# Patient Record
Sex: Male | Born: 1970 | Race: White | Hispanic: No | Marital: Married | Smoking: Former smoker
Health system: Southern US, Community
[De-identification: ages and names within clinical notes are randomized; demographics above are authoritative.]

## PROBLEM LIST (undated history)

## (undated) DIAGNOSIS — H3530 Unspecified macular degeneration: Secondary | ICD-10-CM

## (undated) HISTORY — PX: APPENDECTOMY: SHX54

---

## 2011-05-02 ENCOUNTER — Emergency Department: Payer: Self-pay

## 2012-08-26 IMAGING — CT CT ABD-PELV W/O CM
1 of 2 series · 15 of 32 positions shown, 19 images · non-contrast
Comparison: none

REASON FOR EXAM: (1) Left flank pain, abdominal pain; (2) Left flank
pain, abdominal pain
COMMENTS:   May transport without cardiac monitor

PROCEDURE:     CT  - CT ABDOMEN AND PELVIS W[DATE]  [DATE]
RESULT:     CT abdomen pelvis dated 05/02/2011.
TECHNIQUE: Helical noncontrasted 3 mm sections were obtained from the lung
bases through the pubic symphysis.

[Series 2: stone · axial · 0.75mm/px · z∈[-514,-22]mm · 15 of 178 slices shown, 19 images]
[im 7/178  soft-tissue]
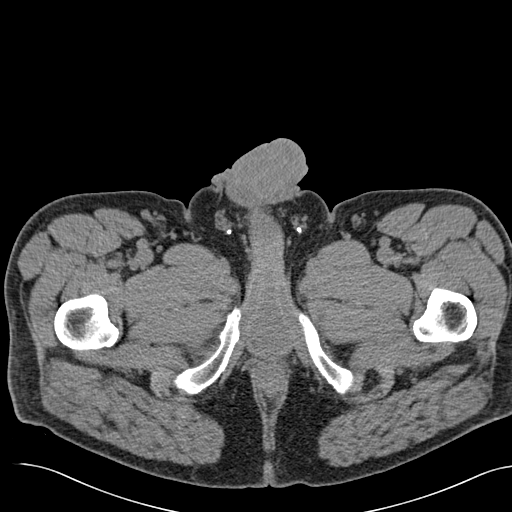
[im 7/178  bone]
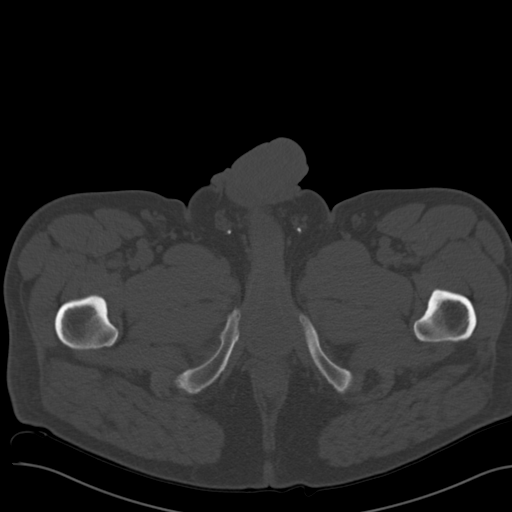
[im 21/178  soft-tissue]
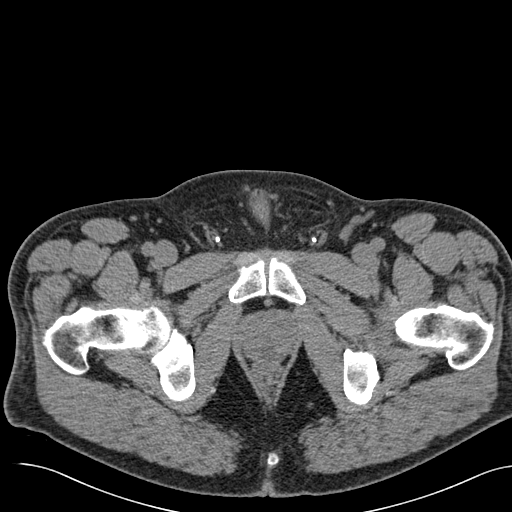
[im 35/178  soft-tissue]
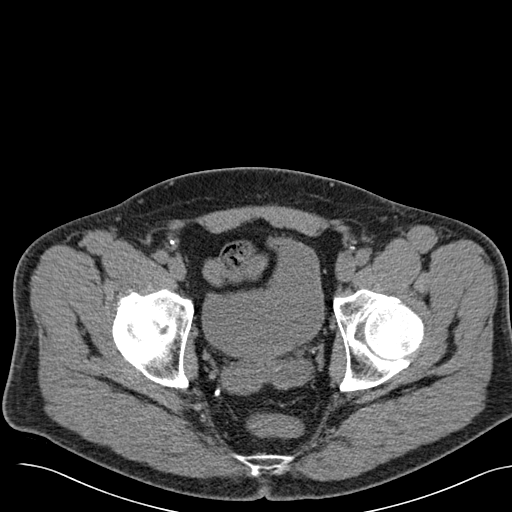
[im 48/178  soft-tissue]
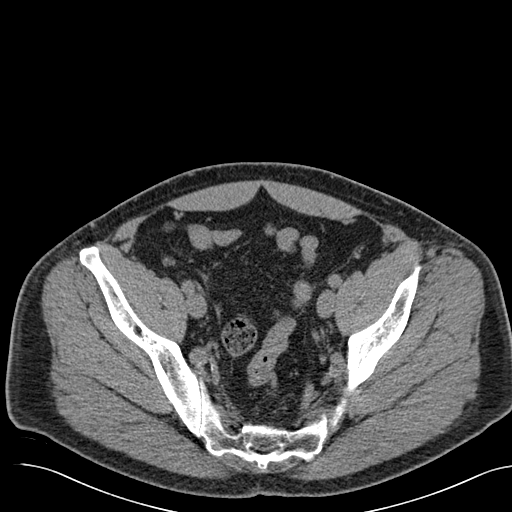
[im 62/178  soft-tissue]
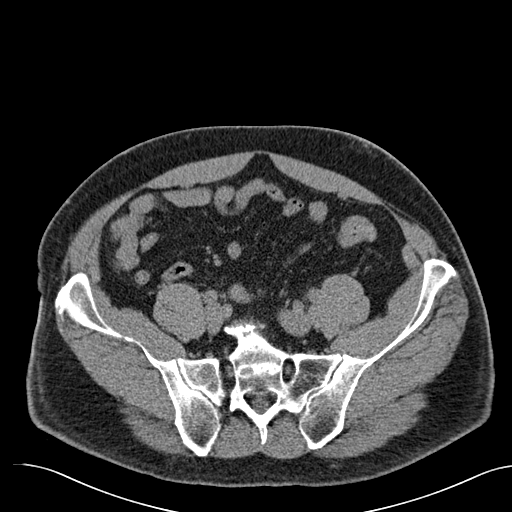
[im 75/178  soft-tissue]
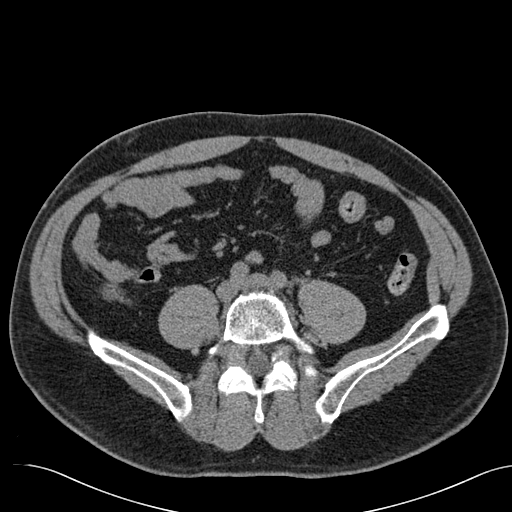
[im 89/178  soft-tissue]
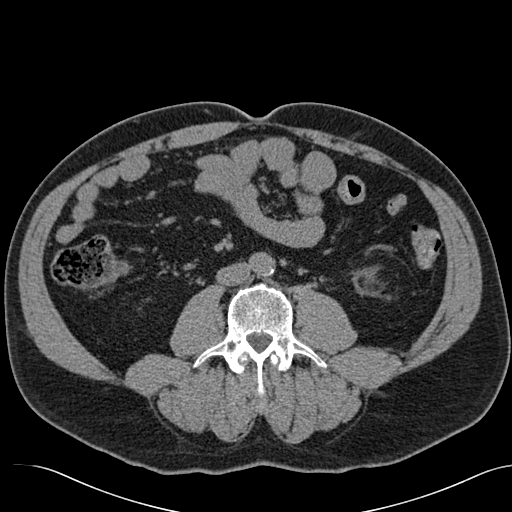
[im 103/178  soft-tissue]
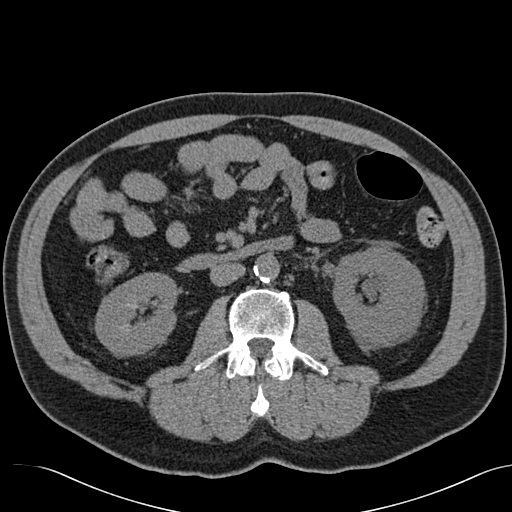
[im 116/178  soft-tissue]
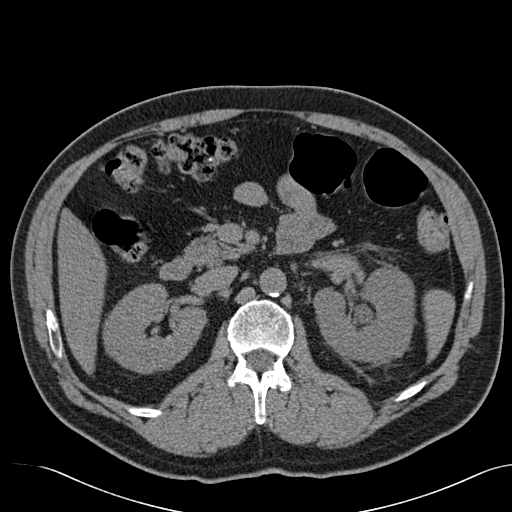
[im 116/178  bone]
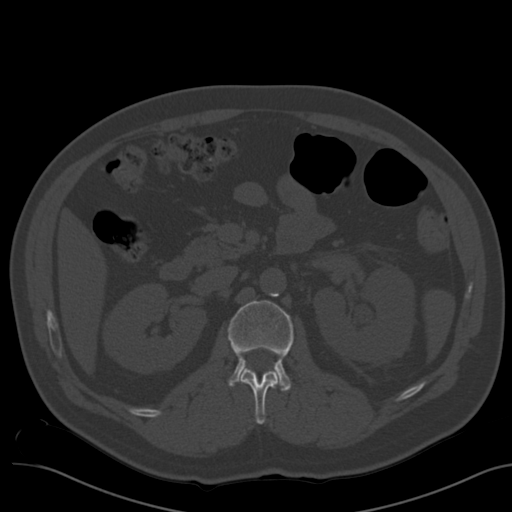
[im 130/178  soft-tissue]
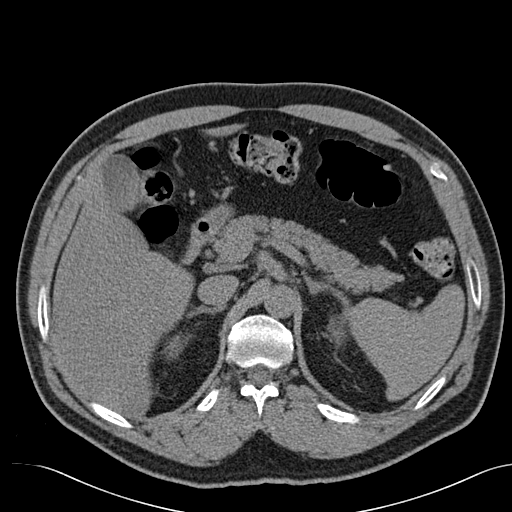
[im 143/178  soft-tissue]
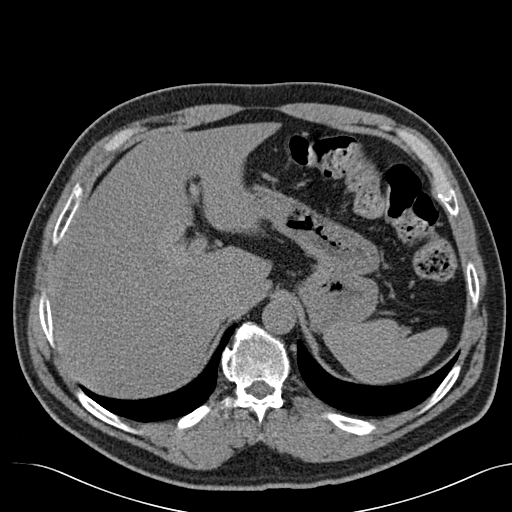
[im 150/178  lung]
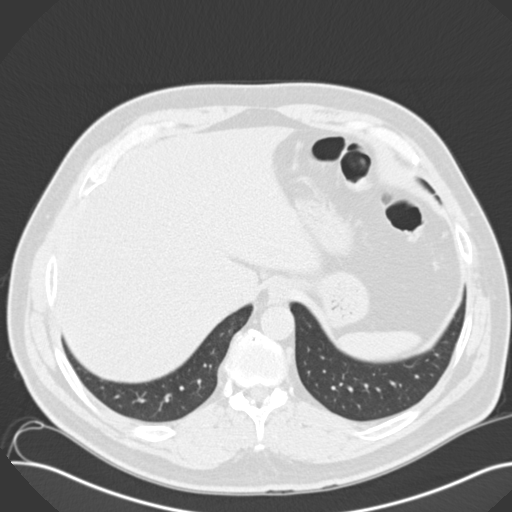
[im 157/178  soft-tissue]
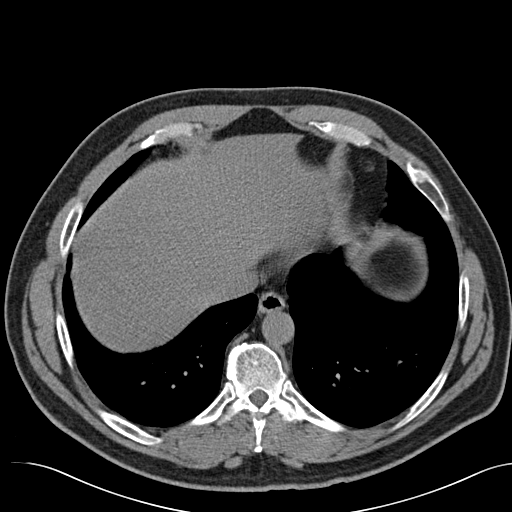
[im 157/178  lung]
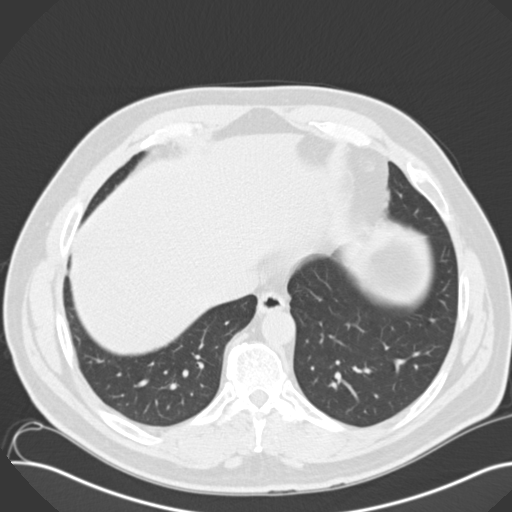
[im 164/178  lung]
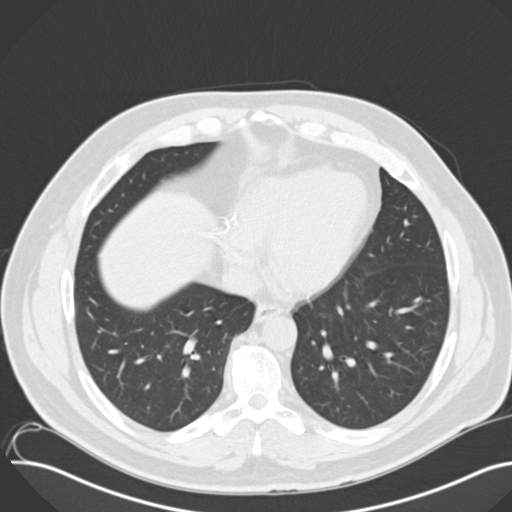
[im 171/178  soft-tissue]
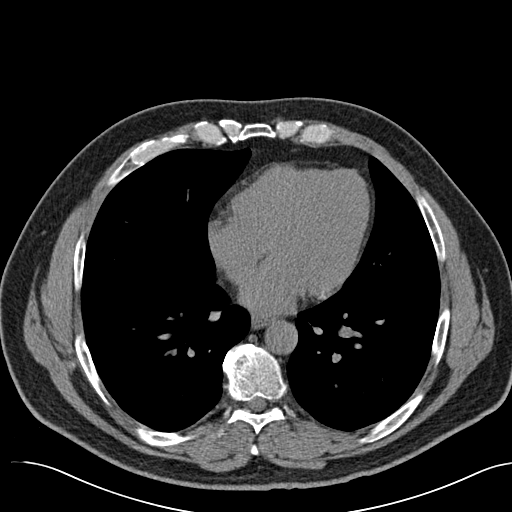
[im 171/178  lung]
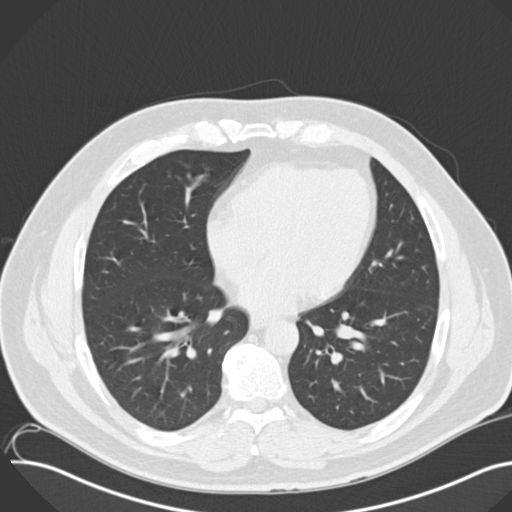

[15 of 32 positions shown; findings below may reference images not displayed]

FINDINGS: The lung bases are unremarkable.

Noncontrast evaluation of the liver, spleen, adrenals, right kidney are
unremarkable.

The left kidney demonstrates mild hydronephrosis. Within the proximal left
ureter a 4.3 mm calculus is identified. There is mild stranding within the
perinephric fat as well as the proximal periureteral fat. This finding is
mild. There is no evidence of free fluid, loculated fluid collections,
masses, nor adenopathy within the abdomen or pelvis. There is no evidence of
bowel obstruction, enteritis, colitis, diverticulitis. There is no evidence
of an abdominal aortic aneurysm.
IMPRESSION: Proximal left ureteral calculus with associated mild
obstructive uropathy

## 2014-01-15 ENCOUNTER — Emergency Department: Payer: Self-pay | Admitting: Emergency Medicine

## 2014-06-29 ENCOUNTER — Emergency Department: Payer: Self-pay | Admitting: Emergency Medicine

## 2014-06-29 LAB — CBC
HCT: 43.4 % (ref 40.0–52.0)
HGB: 14.8 g/dL (ref 13.0–18.0)
MCH: 31.5 pg (ref 26.0–34.0)
MCHC: 34.1 g/dL (ref 32.0–36.0)
MCV: 92 fL (ref 80–100)
Platelet: 229 10*3/uL (ref 150–440)
RBC: 4.71 10*6/uL (ref 4.40–5.90)
RDW: 12.5 % (ref 11.5–14.5)
WBC: 6.9 10*3/uL (ref 3.8–10.6)

## 2014-06-29 LAB — COMPREHENSIVE METABOLIC PANEL
ANION GAP: 15 (ref 7–16)
Albumin: 3.9 g/dL (ref 3.4–5.0)
Alkaline Phosphatase: 78 U/L
BUN: 13 mg/dL (ref 7–18)
Bilirubin,Total: 0.5 mg/dL (ref 0.2–1.0)
CALCIUM: 8.4 mg/dL — AB (ref 8.5–10.1)
CHLORIDE: 102 mmol/L (ref 98–107)
CREATININE: 0.9 mg/dL (ref 0.60–1.30)
Co2: 23 mmol/L (ref 21–32)
EGFR (African American): >60
Glucose: 276 mg/dL — ABNORMAL HIGH (ref 65–99)
Osmolality: 289 (ref 275–301)
Potassium: 3.7 mmol/L (ref 3.5–5.1)
SGOT(AST): 20 U/L (ref 15–37)
SGPT (ALT): 35 U/L
SODIUM: 140 mmol/L (ref 136–145)
Total Protein: 7.3 g/dL (ref 6.4–8.2)

## 2014-06-29 LAB — DRUG SCREEN, URINE
Amphetamines, Ur Screen: NEGATIVE (ref ?–1000)
BENZODIAZEPINE, UR SCRN: POSITIVE (ref ?–200)
Barbiturates, Ur Screen: NEGATIVE (ref ?–200)
Cannabinoid 50 Ng, Ur ~~LOC~~: POSITIVE (ref ?–50)
Cocaine Metabolite,Ur ~~LOC~~: NEGATIVE (ref ?–300)
MDMA (Ecstasy)Ur Screen: NEGATIVE (ref ?–500)
Methadone, Ur Screen: NEGATIVE (ref ?–300)
OPIATE, UR SCREEN: NEGATIVE (ref ?–300)
Phencyclidine (PCP) Ur S: NEGATIVE (ref ?–25)
Tricyclic, Ur Screen: NEGATIVE (ref ?–1000)

## 2014-06-29 LAB — ACETAMINOPHEN LEVEL

## 2014-06-29 LAB — TSH: THYROID STIMULATING HORM: 1.53 u[IU]/mL

## 2014-06-29 LAB — ETHANOL
ETHANOL %: 0.072 % (ref 0.000–0.080)
Ethanol: 72 mg/dL

## 2014-06-29 LAB — SALICYLATE LEVEL

## 2015-03-07 NOTE — Consult Note (Signed)
PATIENT NAME:  Sergio GreathouseJONES, Sergio Mueller MR#:  [id number] DATE OF BIRTH:  December 31, 1970  DATE OF CONSULTATION:  06/29/2014  REFERRING PHYSICIAN:   CONSULTING PHYSICIAN:  Grae Leathers K. Kristl Morioka, MD  SUBJECTIVE: The patient was seen in consultation in room #21A in [facility] Emergency Room [facility] 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 The patient is married for over 20 years and lives with his wife. He has been having constant conflict with his wife about his children who are 6219 and 581 years old and in addition other problems as she likes to do things her own way. The patient [facility] by Dr. Roda ShuttersXu who gave him Xanax 0.5 mg tablet. The patient got into an argument and had poor impulse control and took a bunch of them from the bottle and came here for help.  PAST PSYCHIATRIC HISTORY: No previous history of inpatient or psychiatry. No history of suicide attempts. Being followed by Dr. Roda ShuttersXu once in 3 months for many years for anxiety.   ALCOHOL AND DRUGS: He has an occasional drink of alcohol. Denies street drug or prescription drug abuse.  MENTAL STATUS EXAMINATION: The patient alert and oriented to place, person, name; calm, pleasant and cooperative. Affect is neutral, mood stable. Denies feeling depressed. Denies feeling hopeless or helpless. No psychosis. Cognition intact. General knowledge and information fair. He does admit to poor impulse control secondary to marital conflicts. Denies any ideas or plans to hurt himself or others. Insight and judgment improved. Contracts for safety and is eager to go home and get help from counselors.  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 [place]  RECOMMENDATIONS: Discontinue IVC and discharge the patient home and he will get help from counselors and where he can have better marital  relationship and insight into help.   ____________________________ Jannet MantisSurya K. Guss Bundehalla, MD skc:TT D: 06/29/2014 17:56:55 ET T: 06/29/2014 20:52:57 ET JOB#: [id number]  cc: Monika SalkSurya K. Guss Bundehalla, MD, <Dictator> Beau FannySURYA K Setsuko Robins MD ELECTRONICALLY SIGNED 07/02/2014 23:01

## 2015-10-24 IMAGING — CR DG CHEST 1V PORT
1 series · 1 of 1 positions shown · non-contrast
Comparison: None.

CLINICAL DATA: Overdose.

EXAM:
PORTABLE CHEST - 1 VIEW

[ap]
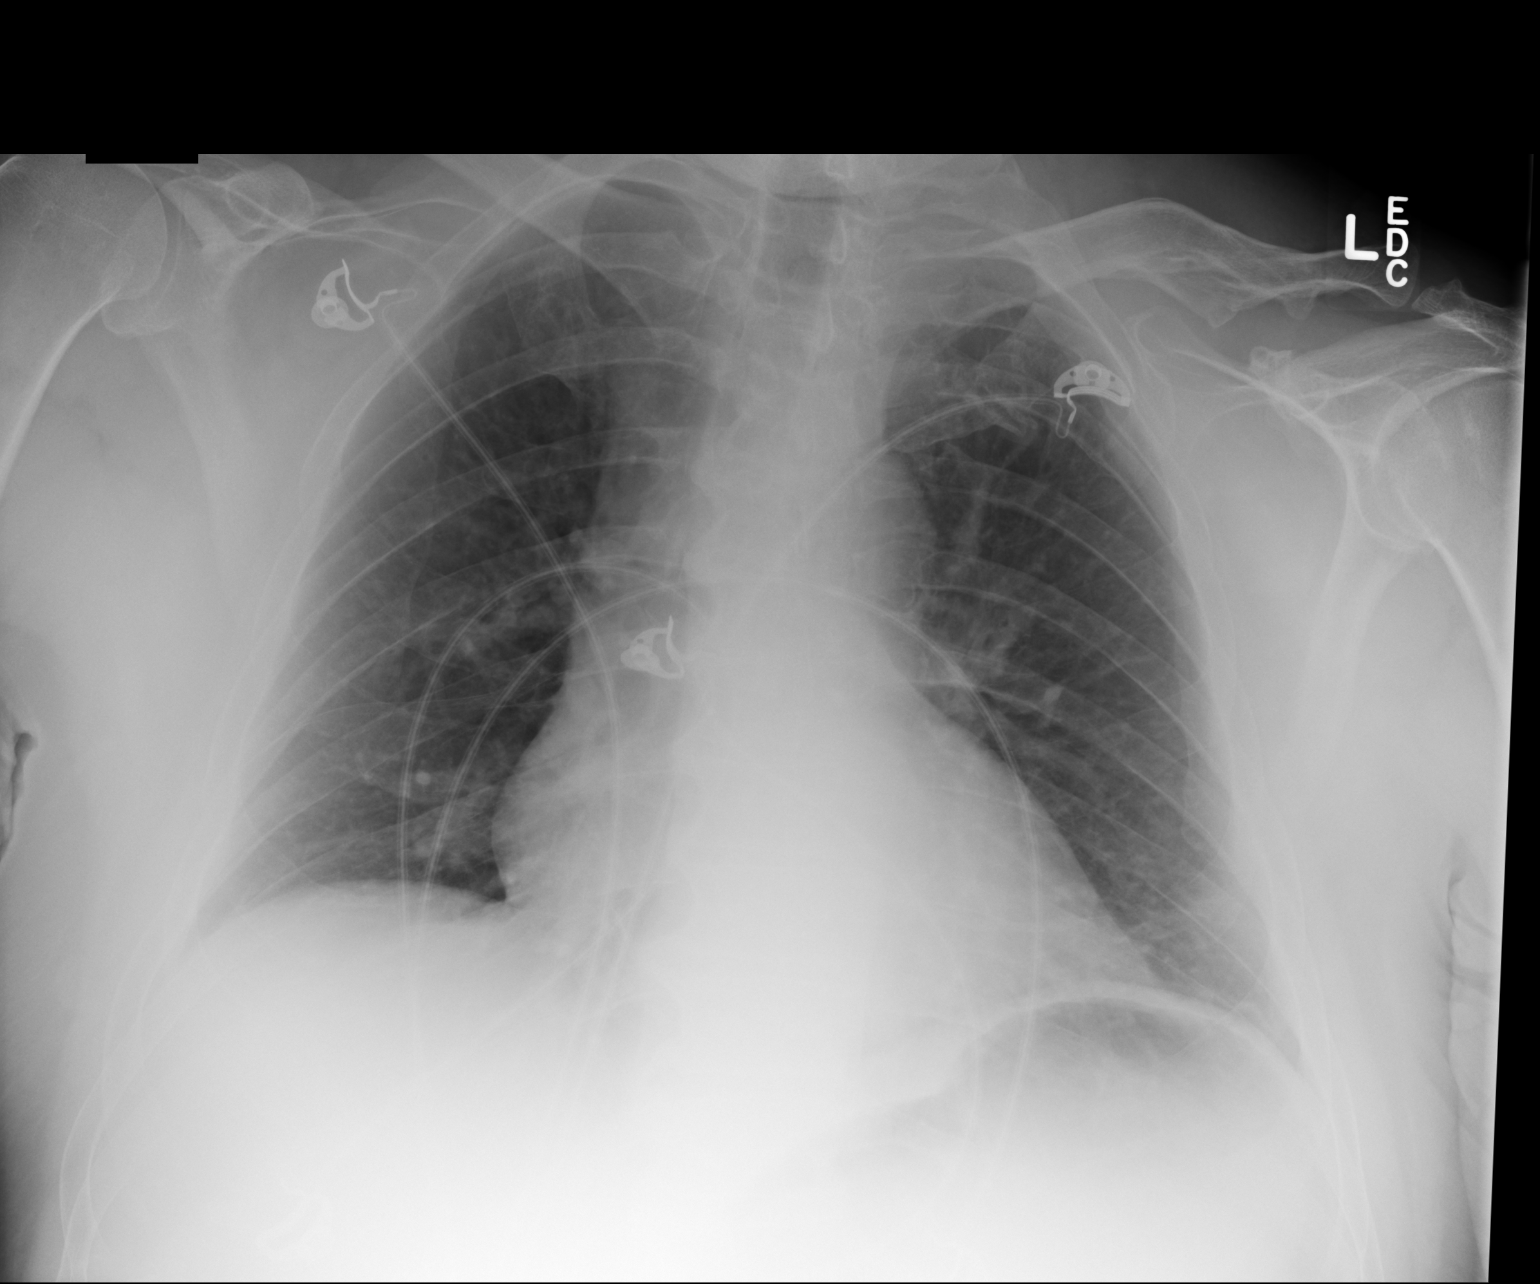

[1 of 1 positions shown; findings below may reference images not displayed]

FINDINGS: Minimal left base atelectasis. Right lung is clear. Heart is normal
size. No effusions or acute bony abnormality.
IMPRESSION: Minimal left base atelectasis.

## 2017-09-22 ENCOUNTER — Encounter: Payer: Self-pay | Admitting: Emergency Medicine

## 2017-09-22 ENCOUNTER — Other Ambulatory Visit: Payer: Self-pay

## 2017-09-22 ENCOUNTER — Emergency Department
Admission: EM | Admit: 2017-09-22 | Discharge: 2017-09-22 | Disposition: A | Discharge status: Home / Self Care | Payer: Worker's Compensation | Attending: Emergency Medicine | Admitting: Emergency Medicine

## 2017-09-22 DIAGNOSIS — W2209XA Striking against other stationary object, initial encounter: Secondary | ICD-10-CM | POA: Insufficient documentation

## 2017-09-22 DIAGNOSIS — S0101XA Laceration without foreign body of scalp, initial encounter: Secondary | ICD-10-CM | POA: Diagnosis not present

## 2017-09-22 DIAGNOSIS — Y92814 Boat as the place of occurrence of the external cause: Secondary | ICD-10-CM | POA: Insufficient documentation

## 2017-09-22 DIAGNOSIS — Z87891 Personal history of nicotine dependence: Secondary | ICD-10-CM | POA: Insufficient documentation

## 2017-09-22 DIAGNOSIS — Y939 Activity, unspecified: Secondary | ICD-10-CM | POA: Insufficient documentation

## 2017-09-22 DIAGNOSIS — Y999 Unspecified external cause status: Secondary | ICD-10-CM | POA: Diagnosis not present

## 2017-09-22 HISTORY — DX: Unspecified macular degeneration: H35.30

## 2017-09-22 MED ORDER — LIDOCAINE VISCOUS 2 % MT SOLN: 15.0000 mL | Freq: Once | OROMUCOSAL | Status: DC

## 2017-09-22 MED ORDER — TETANUS-DIPHTH-ACELL PERTUSSIS 5-2.5-18.5 LF-MCG/0.5 IM SUSP
0.5000 mL | Freq: Once | INTRAMUSCULAR | Status: AC
  Administered 2017-09-22: 0.5 mL via INTRAMUSCULAR
  Filled 2017-09-22: qty 0.5

## 2017-09-22 MED ORDER — TETANUS-DIPHTH-ACELL PERTUSSIS 5-2.5-18.5 LF-MCG/0.5 IM SUSP
INTRAMUSCULAR | Status: AC
  Filled 2017-09-22: qty 0.5

## 2017-09-22 MED ORDER — LIDOCAINE-EPINEPHRINE-TETRACAINE (LET) SOLUTION
NASAL | Status: AC
  Filled 2017-09-22: qty 3

## 2017-09-22 MED ORDER — CEPHALEXIN 500 MG PO CAPS: 500.0000 mg | ORAL_CAPSULE | Freq: Four times a day (QID) | ORAL | 0 refills | Status: AC

## 2017-09-22 MED ORDER — LIDOCAINE HCL (PF) 1 % IJ SOLN
5.0000 mL | Freq: Once | INTRAMUSCULAR | Status: AC
  Administered 2017-09-22: 5 mL via INTRADERMAL

## 2017-09-22 MED ORDER — LIDOCAINE-EPINEPHRINE-TETRACAINE (LET) SOLUTION
3.0000 mL | Freq: Once | NASAL | Status: AC
  Filled 2017-09-22: qty 3

## 2017-09-22 MED ORDER — LIDOCAINE HCL (PF) 1 % IJ SOLN
INTRAMUSCULAR | Status: AC
  Administered 2017-09-22: 5 mL via INTRADERMAL
  Filled 2017-09-22: qty 5

## 2017-09-22 NOTE — ED Notes (Signed)

## 2017-09-22 NOTE — ED Provider Notes (Signed)
[facility] Emergency Department Provider Note  ____________________________________________  Time seen: Approximately 6:07 PM  I have reviewed the triage vital signs and the nursing notes.   HISTORY  Chief Complaint Head Laceration    HPI Sergio Mueller is a 46 y.o. male that presents to emergency department for evaluation of head laceration that happened this afternoon.  Patient states that he hit his head on a boat.  No loss of consciousness.  He is not on any blood thinners. He works as a Curatormechanic.  He denies headache, visual changes, dizziness, nausea, vomiting.  Past Medical History:  Diagnosis Date  . Macular degeneration     There are no active problems to display for this patient.   Past Surgical History:  Procedure Laterality Date  . APPENDECTOMY      Prior to Admission medications   Medication Sig Start Date End Date Taking? Authorizing Provider  cephALEXin (KEFLEX) 500 MG capsule Take 1 capsule (500 mg total) 4 (four) times daily for 10 days by mouth. 09/22/17 10/02/17  Enid DerryWagner, Chaka Boyson, PA-C    Allergies Patient has no known allergies.  History reviewed. No pertinent family history.  Social History Social History   Tobacco Use  . Smoking status: Former Smoker    Types: Cigarettes    Last attempt to quit: 11/14/1998    Years since quitting: 18.8  . Smokeless tobacco: Never Used  Substance Use Topics  . Alcohol use: Yes    Comment: occasional  . Drug use: No     Review of Systems  Cardiovascular: No chest pain. Respiratory: No SOB. Gastrointestinal: No nausea, no vomiting.  Musculoskeletal: Negative for musculoskeletal pain. Neurological: Negative for headaches, numbness or tingling   ____________________________________________   PHYSICAL EXAM:  VITAL SIGNS: ED Triage Vitals  Enc Vitals Group     BP 09/22/17 1644 (!) 176/104     Pulse Rate 09/22/17 1644 75     Resp 09/22/17 1644 18     Temp 09/22/17 1644 97.6 F  (36.4 C)     Temp Source 09/22/17 1644 Oral     SpO2 09/22/17 1644 100 %     Weight 09/22/17 1645 215 lb (97.5 kg)     Height 09/22/17 1645 6\' 3"  (1.905 m)     Head Circumference --      Peak Flow --      Pain Score 09/22/17 1644 3     Pain Loc --      Pain Edu? --      Excl. in GC? --      Constitutional: Alert and oriented. Well appearing and in no acute distress. Eyes: Conjunctivae are normal. PERRL. EOMI. Head: ENT:      Ears:      Nose: No congestion/rhinnorhea.      Mouth/Throat: Mucous membranes are moist.  Neck: No stridor.  Cardiovascular: Normal rate, regular rhythm.  Good peripheral circulation. Respiratory: Normal respiratory effort without tachypnea or retractions. Lungs CTAB. Good air entry to the bases with no decreased or absent breath sounds. Musculoskeletal: Full range of motion to all extremities. No gross deformities appreciated. Neurologic:  Normal speech and language. No gross focal neurologic deficits are appreciated.  Skin:  Skin is warm, dry. 3cm laceration to scalp.     ____________________________________________   LABS (all labs ordered are listed, but only abnormal results are displayed)  Labs Reviewed - No data to display ____________________________________________  EKG   ____________________________________________  RADIOLOGY   No results found.  ____________________________________________  PROCEDURES  Procedure(s) performed:    Procedures  LACERATION REPAIR Performed by: Enid DerryAshley Inocencio Roy  Consent: Verbal consent obtained.  Consent given by: patient  Prepped and Draped in normal sterile fashion  Wound explored: No foreign bodies   Laceration Location: Scalp  Laceration Length: 3 cm  Anesthesia: None  Local anesthetic: lidocaine 1% without epinephrine and LET  Anesthetic total: 5 ml  Irrigation method: syringe  Amount of cleaning: 500ml normal saline  Skin closure: staples  Number of sutures:  8  Technique: Simple interrupted  Patient tolerance: Patient tolerated the procedure well with no immediate complications.   Medications  Tdap (BOOSTRIX) injection 0.5 mL (0.5 mLs Intramuscular Given 09/22/17 1750)  lidocaine-EPINEPHrine-tetracaine (LET) solution (0 mLs [facility]09)  lidocaine (PF) (XYLOCAINE) 1 % injection 5 mL (5 mLs Intradermal Given 09/22/17 1753)     ____________________________________________   INITIAL IMPRESSION / ASSESSMENT AND PLAN / ED COURSE  Pertinent labs & imaging results that were available during my care of the patient were reviewed by me and considered in my medical decision making (see chart for details).  Review of the [place] CSRS was performed in accordance of the [facility] prior to dispensing any controlled drugs.   Patient's diagnosis is consistent with head laceration.  Vital signs and exam are reassuring. No loss of consciousness.  Laceration was repaired with staples.  Tetanus shot was updated.  Patient will be discharged home with prescriptions for Keflex. Patient is to follow up with PCP is as directed. Patient is given ED precautions to return to the ED for any worsening or new symptoms.     ____________________________________________  FINAL CLINICAL IMPRESSION(S) / ED DIAGNOSES  Final diagnoses:  Laceration of scalp, initial encounter      NEW MEDICATIONS STARTED DURING THIS VISIT:  This SmartLink is deprecated. Use AVSMEDLIST instead to display the medication list for a patient.      This chart was dictated using voice recognition software/Dragon. Despite best efforts to proofread, errors can occur which can change the meaning. Any change was purely unintentional.    Enid DerryWagner, Justeen Hehr, PA-C 09/22/17 1848    Rockne MenghiniNorman, Anne-Caroline, MD 09/25/17 2151

## 2017-09-22 NOTE — ED Notes (Signed)
Pt and pt wife states workers comp directed him to follow up with work and go directly to [facility] Monday. No UDS or ETOH in ER.

## 2017-09-22 NOTE — ED Triage Notes (Signed)
Pt from work after hitting his head and sustaining a laceration to the crown of his head. Pt denies loc or vision difficulty since the hit. He has macular degeneration and was told to be sure and tell if he has any sort of trauma. He has shots in his eyes once per month.

## 2023-12-27 ENCOUNTER — Ambulatory Visit: Payer: Medicaid Other

## 2023-12-27 ENCOUNTER — Encounter (HOSPITAL_COMMUNITY): Payer: Medicaid Other

## 2024-01-17 ENCOUNTER — Ambulatory Visit: Payer: Medicaid Other
